# Patient Record
Sex: Male | Born: 1985 | Race: White | Hispanic: No | Marital: Married | Smoking: Current every day smoker
Health system: Southern US, Community
[De-identification: ages and names within clinical notes are randomized; demographics above are authoritative.]

## PROBLEM LIST (undated history)

## (undated) DIAGNOSIS — E7800 Pure hypercholesterolemia, unspecified: Secondary | ICD-10-CM

## (undated) DIAGNOSIS — I10 Essential (primary) hypertension: Secondary | ICD-10-CM

## (undated) DIAGNOSIS — K219 Gastro-esophageal reflux disease without esophagitis: Secondary | ICD-10-CM

---

## 2008-05-13 ENCOUNTER — Emergency Department (HOSPITAL_COMMUNITY): Admission: EM | Admit: 2008-05-13 | Discharge: 2008-05-13 | Payer: Self-pay | Admitting: Emergency Medicine

## 2010-06-07 ENCOUNTER — Emergency Department (HOSPITAL_COMMUNITY): Admission: EM | Admit: 2010-06-07 | Discharge: 2010-06-07 | Payer: Self-pay | Admitting: Emergency Medicine

## 2010-12-30 LAB — DIFFERENTIAL
Basophils Relative: 1 % (ref 0–1)
Eosinophils Absolute: 0.3 10*3/uL (ref 0.0–0.7)
Eosinophils Relative: 3 % (ref 0–5)
Lymphocytes Relative: 19 % (ref 12–46)
Lymphs Abs: 2.1 10*3/uL (ref 0.7–4.0)
Monocytes Absolute: 1.2 10*3/uL — ABNORMAL HIGH (ref 0.1–1.0)
Neutro Abs: 7.5 10*3/uL (ref 1.7–7.7)
Neutrophils Relative %: 67 % (ref 43–77)

## 2010-12-30 LAB — POCT CARDIAC MARKERS: Troponin i, poc: <0.05 ng/mL (ref 0.00–0.09)

## 2010-12-30 LAB — CBC
HCT: 39.9 % (ref 39.0–52.0)
Hemoglobin: 14.3 g/dL (ref 13.0–17.0)
MCV: 86 fL (ref 78.0–100.0)
RDW: 12.7 % (ref 11.5–15.5)
WBC: 11.1 10*3/uL — ABNORMAL HIGH (ref 4.0–10.5)

## 2010-12-30 LAB — COMPREHENSIVE METABOLIC PANEL
Alkaline Phosphatase: 75 U/L (ref 39–117)
CO2: 28 mEq/L (ref 19–32)
Calcium: 8.8 mg/dL (ref 8.4–10.5)
GFR calc non Af Amer: >60 mL/min (ref >60)
Glucose, Bld: 93 mg/dL (ref 70–99)
Potassium: 3.3 mEq/L — ABNORMAL LOW (ref 3.5–5.1)
Sodium: 135 mEq/L (ref 135–145)
Total Bilirubin: 0.5 mg/dL (ref 0.3–1.2)
Total Protein: 7.4 g/dL (ref 6.0–8.3)

## 2010-12-30 LAB — D-DIMER, QUANTITATIVE: D-Dimer, Quant: 0.92 ug/mL-FEU — ABNORMAL HIGH (ref 0.00–0.48)

## 2011-08-21 IMAGING — CT CT ANGIO CHEST
2 of 6 series · 19 of 36 positions shown · IV contrast (APPLIED)
Comparison: None available.

CLINICAL DATA: Chest pain.

CT ANGIOGRAPHY CHEST WITH CONTRAST
TECHNIQUE: Multidetector CT imaging of the chest was performed
using the standard protocol during bolus administration of
intravenous contrast.  Multiplanar CT image reconstructions
including MIPs were obtained to evaluate the vascular anatomy.
Contrast:  100 ml of [id number]

[Series 8: pulm embolism 1.0 b25f thins · axial · 0.63mm/px · z∈[-254,-38]mm · 18 of 240 slices shown]
[im 12/240  lung]
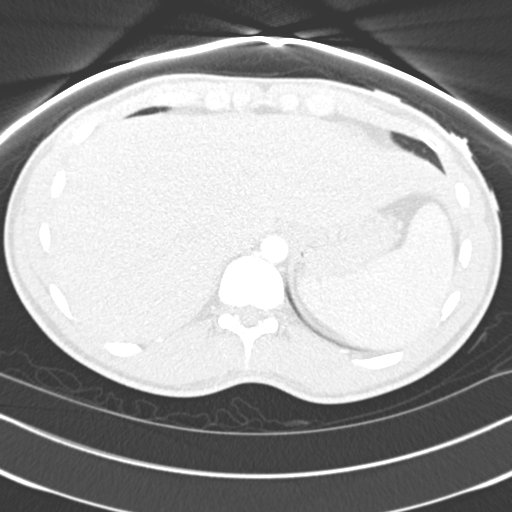
[im 24/240  mediastinal]
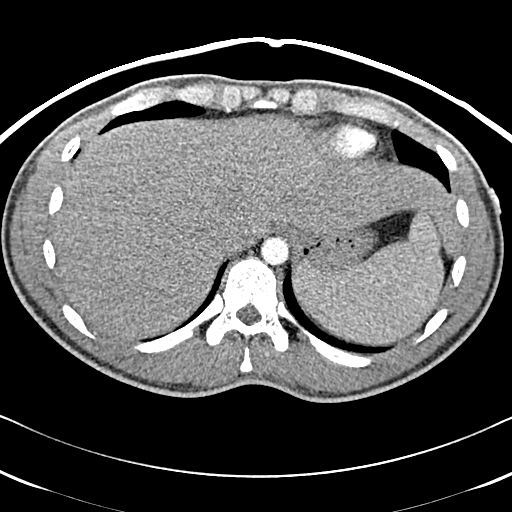
[im 36/240  lung]
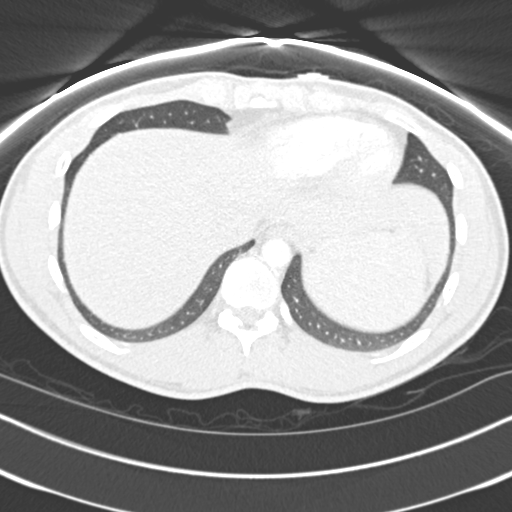
[im 48/240  mediastinal]
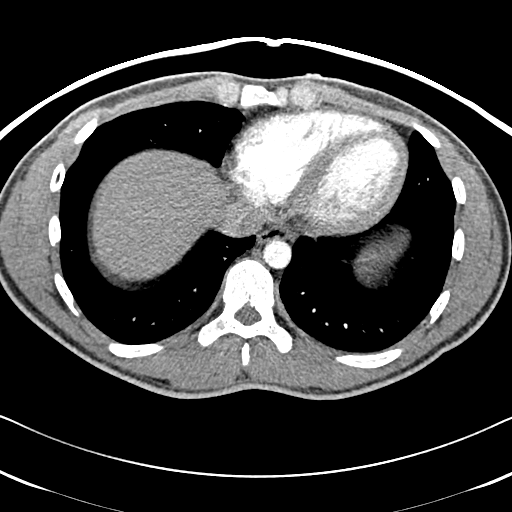
[im 60/240  lung]
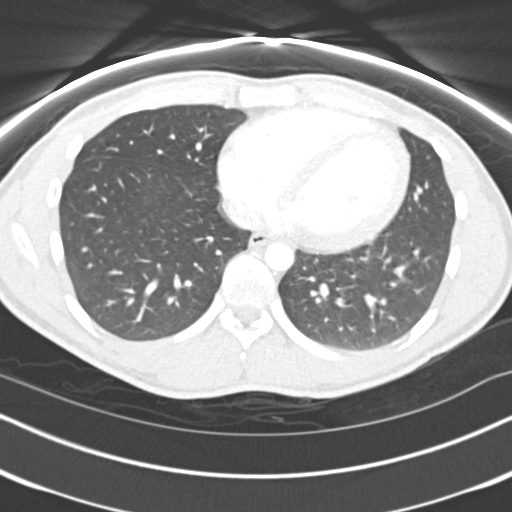
[im 72/240  mediastinal]
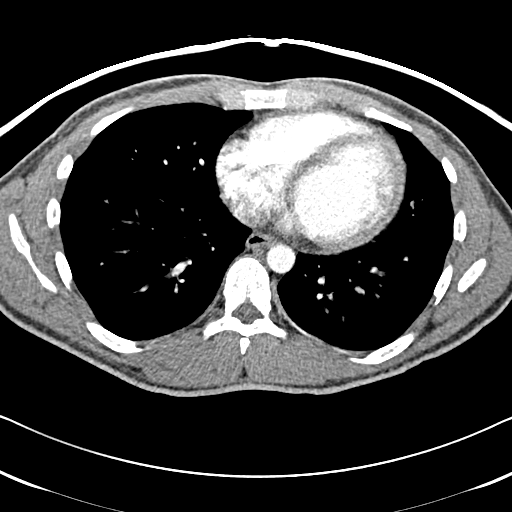
[im 84/240  lung]
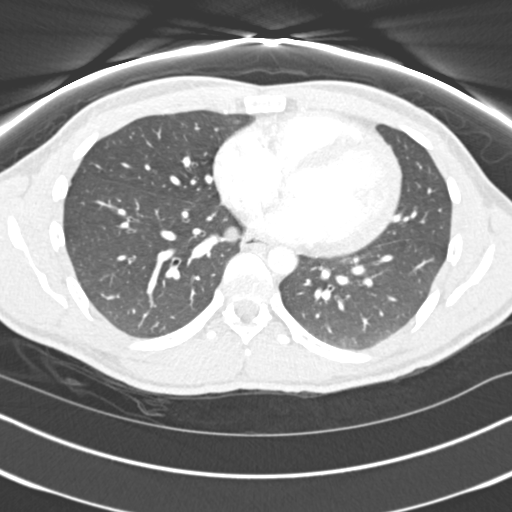
[im 96/240  mediastinal]
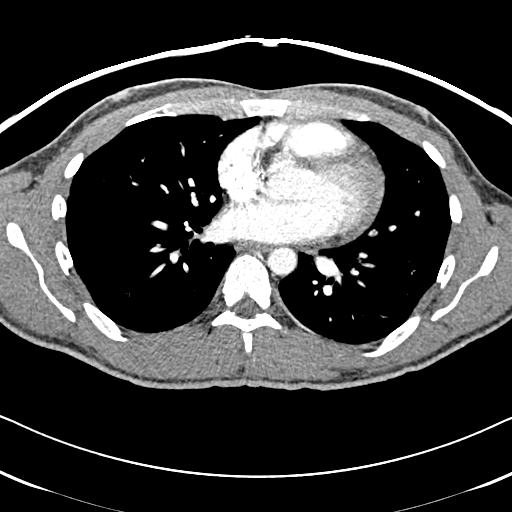
[im 108/240  lung]
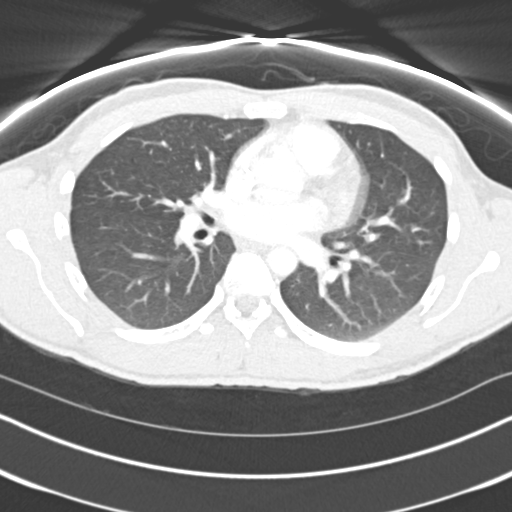
[im 132/240  mediastinal]
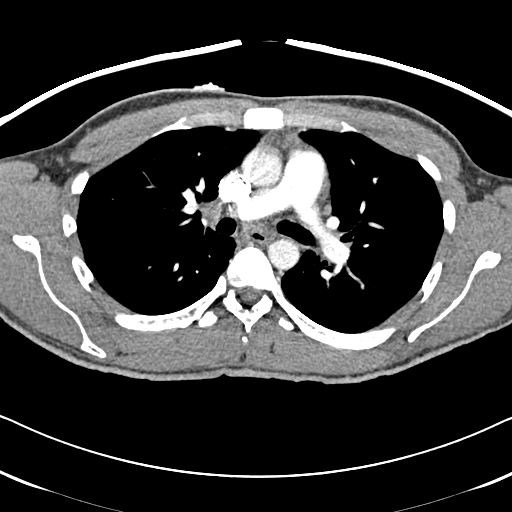
[im 144/240  lung]
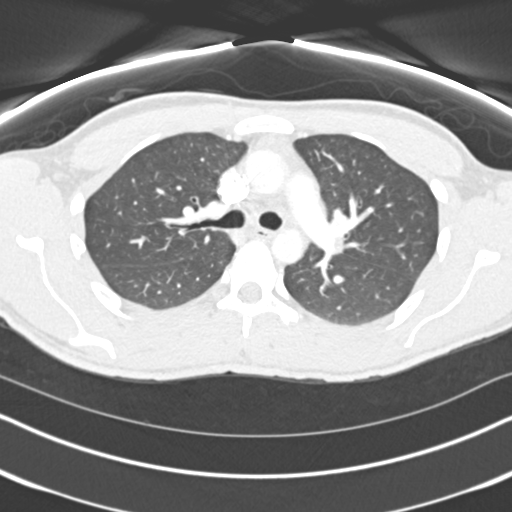
[im 156/240  mediastinal]
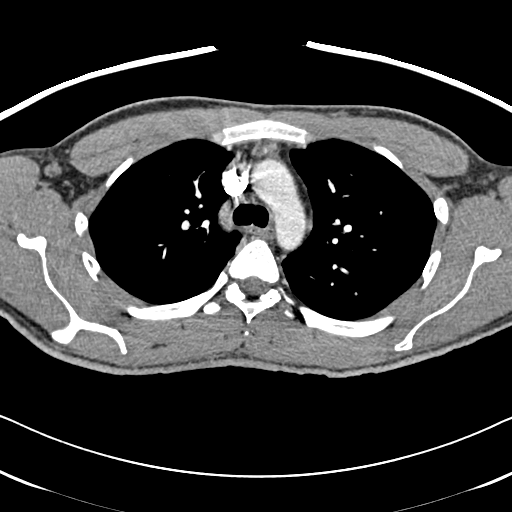
[im 168/240  lung]
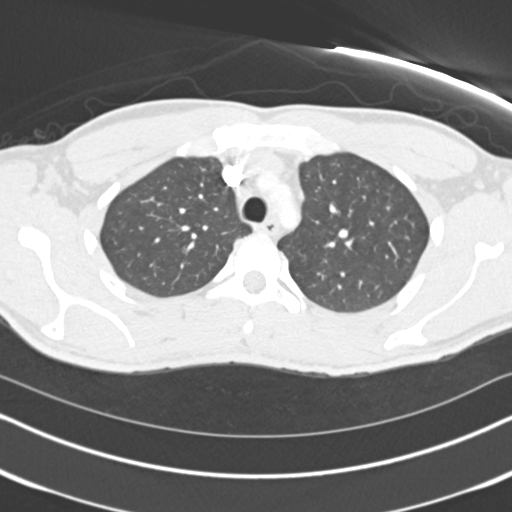
[im 180/240  mediastinal]
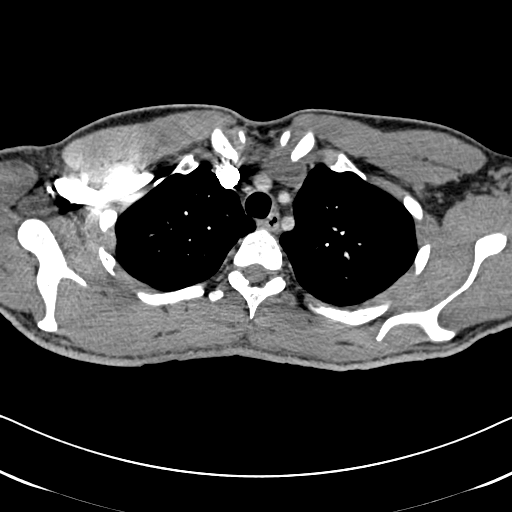
[im 192/240  lung]
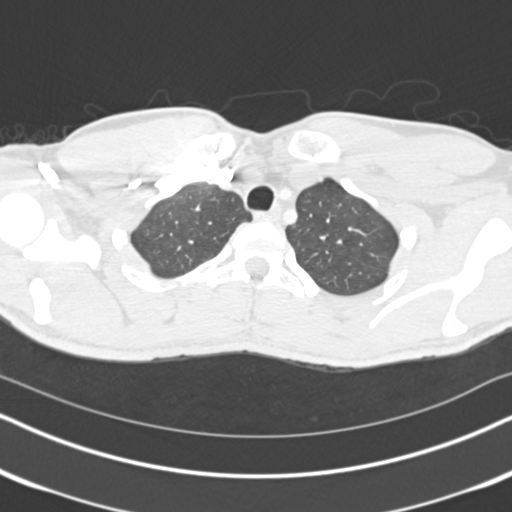
[im 204/240  mediastinal]
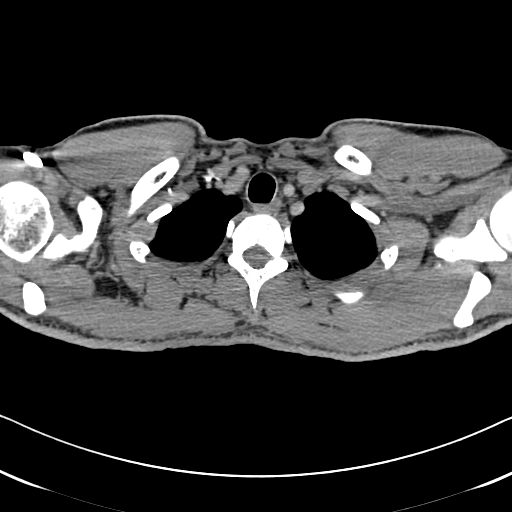
[im 216/240  lung]
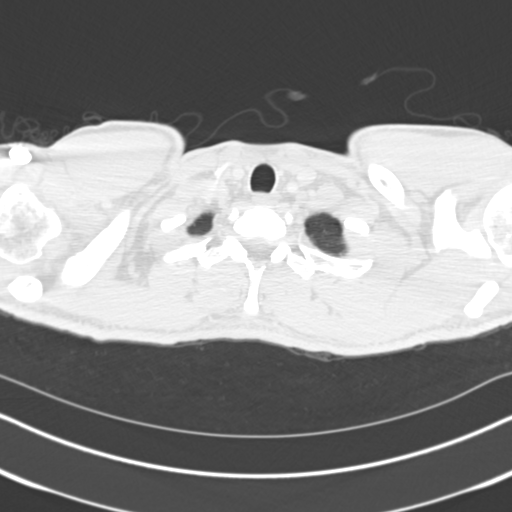
[im 228/240  mediastinal]
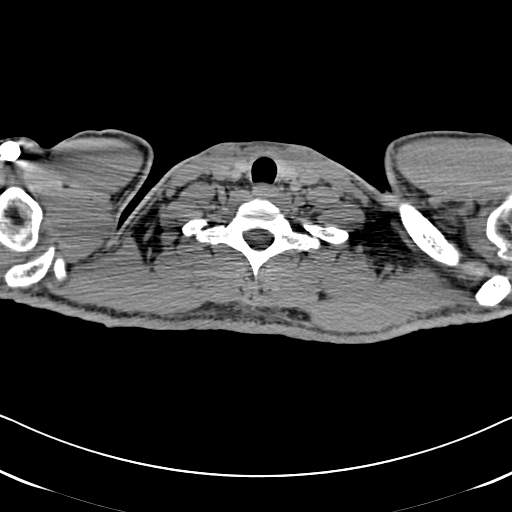

[Series 602: cor · coronal · 0.63mm/px · 1 of 81 slices shown]
[im 41/81  mediastinal]
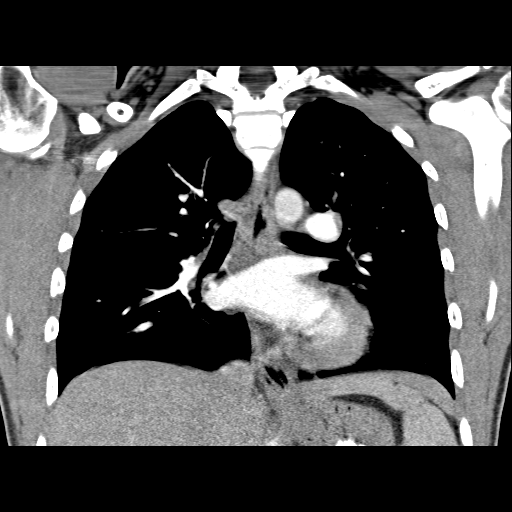

[19 of 36 positions shown; findings below may reference images not displayed]

FINDINGS: The chest wall is unremarkable.  The bony thorax is
intact.

The heart is normal in size.  No pericardial effusion.  No
mediastinal or hilar adenopathy.  The esophagus is grossly normal.
The aorta is normal in caliber.  No dissection.  Minimal residual
thymic tissue noted in the anterior mediastinum.

The pulmonary arterial tree is fairly well opacified.  No filling
defects are seen to suggest pulmonary emboli.

Examination of the lung parenchyma demonstrates no acute pulmonary
findings.  No pleural effusion.  The tracheobronchial tree appears
normal.

Review of the MIP images confirms the above findings.
IMPRESSION: 1.  No CT findings for pulmonary embolism.
2.  Normal thoracic aorta.
3.  No acute pulmonary findings.

## 2011-08-21 IMAGING — CR DG ABDOMEN ACUTE W/ 1V CHEST
3 series · 3 of 3 positions shown · non-contrast
Comparison: None.

CLINICAL DATA: Chest pain, abdominal pain

ACUTE ABDOMEN SERIES (ABDOMEN 2 VIEW & CHEST 1 VIEW)

[w chest pa]
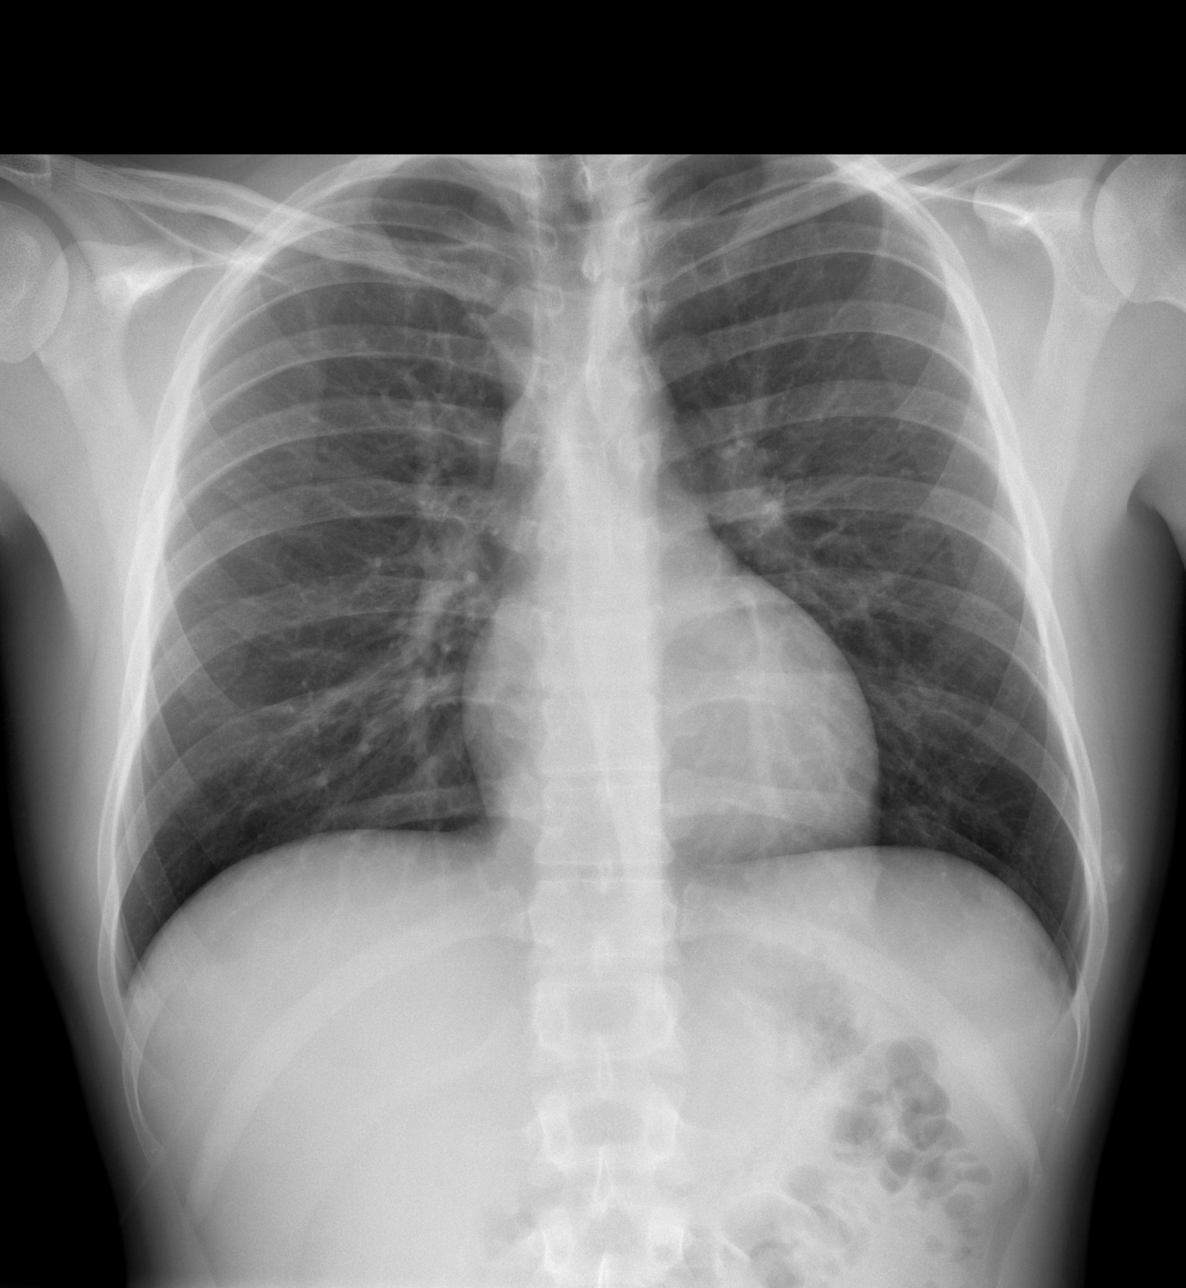

[w abdomen upright]
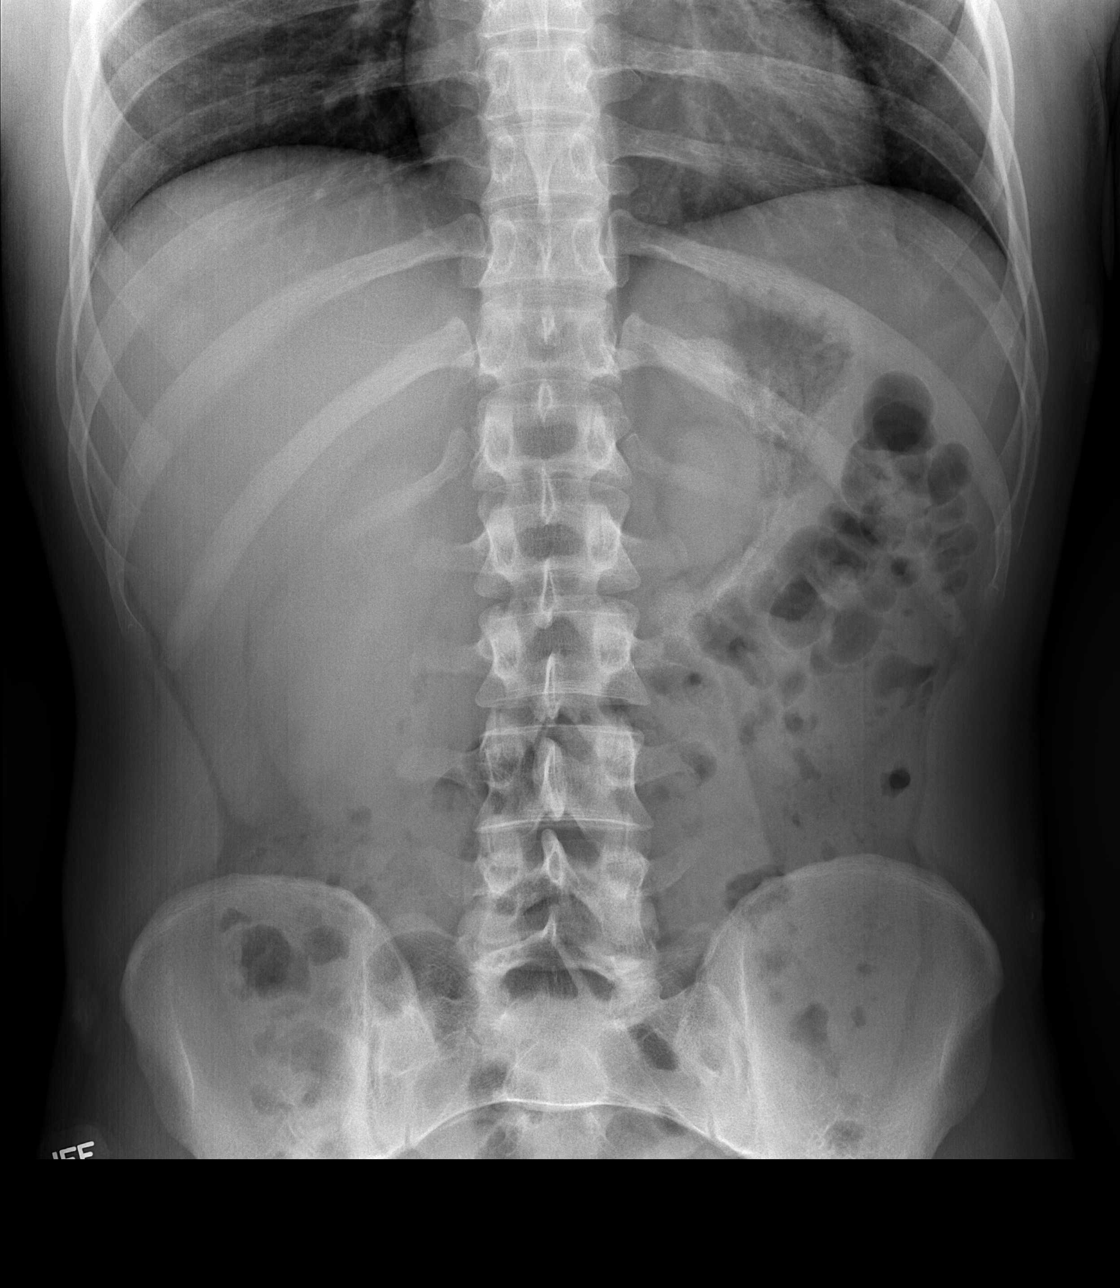

[t abdomen supine]
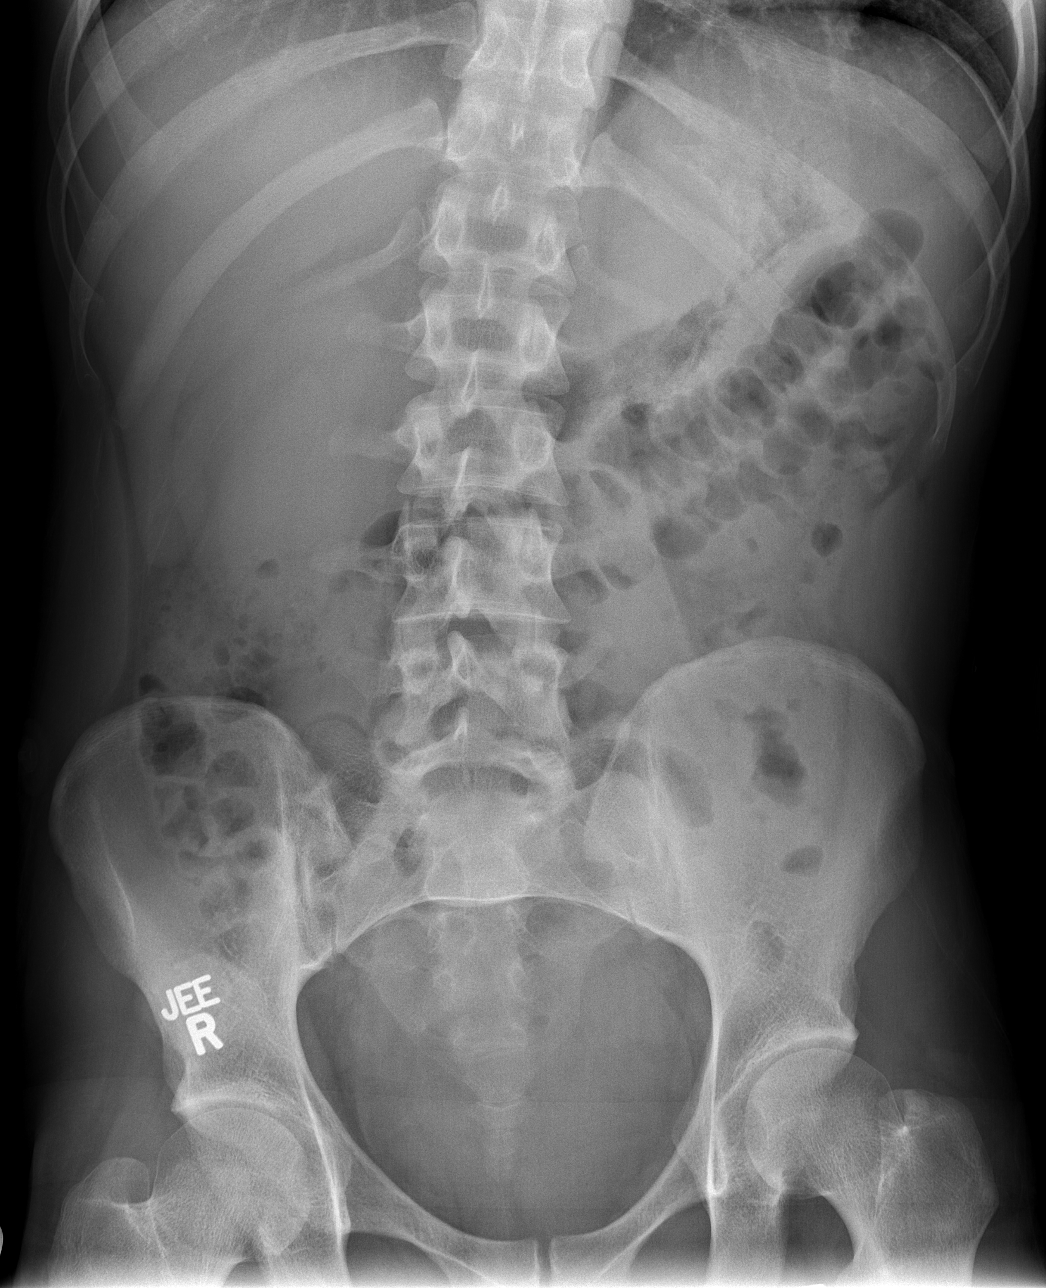

[3 of 3 positions shown; findings below may reference images not displayed]

FINDINGS: Normal mediastinum and cardiac silhouette.  Lungs are
clear.  No free air beneath hemidiaphragms.

No dilated loops of large or small bowel.  No gas the rectum on
this film.  No pathologic calcifications.  No acute bony
abnormality.
IMPRESSION: 1.  No no acute cardiopulmonary process.
2.  No evidence of intraperitoneal free air or bowel obstruction.

## 2021-08-26 ENCOUNTER — Other Ambulatory Visit: Payer: Self-pay

## 2021-08-26 ENCOUNTER — Ambulatory Visit (HOSPITAL_BASED_OUTPATIENT_CLINIC_OR_DEPARTMENT_OTHER): Payer: 59 | Admitting: Anesthesiology

## 2021-08-26 ENCOUNTER — Encounter (HOSPITAL_BASED_OUTPATIENT_CLINIC_OR_DEPARTMENT_OTHER): Payer: Self-pay | Admitting: Orthopedic Surgery

## 2021-08-26 ENCOUNTER — Encounter (HOSPITAL_BASED_OUTPATIENT_CLINIC_OR_DEPARTMENT_OTHER)
Admission: RE | Disposition: A | Discharge status: Home / Self Care | Payer: Self-pay | Source: Ambulatory Visit | Attending: Orthopedic Surgery

## 2021-08-26 ENCOUNTER — Other Ambulatory Visit: Payer: Self-pay | Admitting: Orthopedic Surgery

## 2021-08-26 ENCOUNTER — Ambulatory Visit (HOSPITAL_BASED_OUTPATIENT_CLINIC_OR_DEPARTMENT_OTHER)
Admission: RE | Admit: 2021-08-26 | Discharge: 2021-08-26 | Disposition: A | Discharge status: Home / Self Care | Payer: 59 | Source: Ambulatory Visit | Attending: Orthopedic Surgery | Admitting: Orthopedic Surgery

## 2021-08-26 DIAGNOSIS — I10 Essential (primary) hypertension: Secondary | ICD-10-CM | POA: Insufficient documentation

## 2021-08-26 DIAGNOSIS — M00042 Staphylococcal arthritis, left hand: Secondary | ICD-10-CM | POA: Diagnosis present

## 2021-08-26 DIAGNOSIS — F17200 Nicotine dependence, unspecified, uncomplicated: Secondary | ICD-10-CM | POA: Insufficient documentation

## 2021-08-26 HISTORY — DX: Gastro-esophageal reflux disease without esophagitis: K21.9

## 2021-08-26 HISTORY — DX: Essential (primary) hypertension: I10

## 2021-08-26 HISTORY — DX: Pure hypercholesterolemia, unspecified: E78.00

## 2021-08-26 HISTORY — PX: INCISION AND DRAINAGE: SHX5863

## 2021-08-26 SURGERY — INCISION AND DRAINAGE
Anesthesia: General | Site: Hand | Laterality: Left

## 2021-08-26 MED ORDER — ONDANSETRON HCL 4 MG/2ML IJ SOLN
INTRAMUSCULAR | Status: AC
  Filled 2021-08-26: qty 2

## 2021-08-26 MED ORDER — FENTANYL CITRATE (PF) 100 MCG/2ML IJ SOLN
INTRAMUSCULAR | Status: DC | PRN
  Administered 2021-08-26 (×2): 50 ug via INTRAVENOUS

## 2021-08-26 MED ORDER — MIDAZOLAM HCL 2 MG/2ML IJ SOLN
INTRAMUSCULAR | Status: AC
  Filled 2021-08-26: qty 2

## 2021-08-26 MED ORDER — 0.9 % SODIUM CHLORIDE (POUR BTL) OPTIME
TOPICAL | Status: DC | PRN
  Administered 2021-08-26: 100 mL

## 2021-08-26 MED ORDER — MIDAZOLAM HCL 5 MG/5ML IJ SOLN
INTRAMUSCULAR | Status: DC | PRN
Start: 2021-08-26 — End: 2021-08-26
  Administered 2021-08-26: 2 mg via INTRAVENOUS

## 2021-08-26 MED ORDER — CEFAZOLIN SODIUM-DEXTROSE 2-4 GM/100ML-% IV SOLN
INTRAVENOUS | Status: AC
  Filled 2021-08-26: qty 100

## 2021-08-26 MED ORDER — ACETAMINOPHEN 10 MG/ML IV SOLN: 1000.0000 mg | Freq: Once | INTRAVENOUS | Status: DC | PRN

## 2021-08-26 MED ORDER — CEFAZOLIN SODIUM-DEXTROSE 2-3 GM-%(50ML) IV SOLR
INTRAVENOUS | Status: DC | PRN
  Administered 2021-08-26: 2 g via INTRAVENOUS

## 2021-08-26 MED ORDER — OXYCODONE HCL 5 MG PO TABS: 5.0000 mg | ORAL_TABLET | Freq: Once | ORAL | Status: DC | PRN

## 2021-08-26 MED ORDER — LIDOCAINE 2% (20 MG/ML) 5 ML SYRINGE
INTRAMUSCULAR | Status: AC
  Filled 2021-08-26: qty 5

## 2021-08-26 MED ORDER — PROPOFOL 10 MG/ML IV BOLUS
INTRAVENOUS | Status: DC | PRN
  Administered 2021-08-26: 180 mg via INTRAVENOUS

## 2021-08-26 MED ORDER — BUPIVACAINE HCL (PF) 0.25 % IJ SOLN
INTRAMUSCULAR | Status: DC | PRN
  Administered 2021-08-26: 9 mL

## 2021-08-26 MED ORDER — OXYCODONE HCL 5 MG/5ML PO SOLN: 5.0000 mg | Freq: Once | ORAL | Status: DC | PRN

## 2021-08-26 MED ORDER — FENTANYL CITRATE (PF) 100 MCG/2ML IJ SOLN
INTRAMUSCULAR | Status: AC
  Filled 2021-08-26: qty 2

## 2021-08-26 MED ORDER — LACTATED RINGERS IV SOLN: INTRAVENOUS | Status: DC

## 2021-08-26 MED ORDER — PROPOFOL 10 MG/ML IV BOLUS
INTRAVENOUS | Status: AC
  Filled 2021-08-26: qty 40

## 2021-08-26 MED ORDER — ACETAMINOPHEN 160 MG/5ML PO SOLN: 1000.0000 mg | Freq: Once | ORAL | Status: DC | PRN

## 2021-08-26 MED ORDER — FENTANYL CITRATE (PF) 100 MCG/2ML IJ SOLN: 25.0000 ug | INTRAMUSCULAR | Status: DC | PRN

## 2021-08-26 MED ORDER — DEXAMETHASONE SODIUM PHOSPHATE 10 MG/ML IJ SOLN
INTRAMUSCULAR | Status: DC | PRN
  Administered 2021-08-26: 10 mg via INTRAVENOUS

## 2021-08-26 MED ORDER — DEXAMETHASONE SODIUM PHOSPHATE 10 MG/ML IJ SOLN
INTRAMUSCULAR | Status: AC
  Filled 2021-08-26: qty 1

## 2021-08-26 MED ORDER — ACETAMINOPHEN 500 MG PO TABS: 1000.0000 mg | ORAL_TABLET | Freq: Once | ORAL | Status: DC | PRN

## 2021-08-26 MED ORDER — HYDROCODONE-ACETAMINOPHEN 5-325 MG PO TABS: ORAL_TABLET | ORAL | 0 refills | Status: AC

## 2021-08-26 MED ORDER — LIDOCAINE 2% (20 MG/ML) 5 ML SYRINGE
INTRAMUSCULAR | Status: DC | PRN
  Administered 2021-08-26: 60 mg via INTRAVENOUS

## 2021-08-26 SURGICAL SUPPLY — 53 items
APL PRP STRL LF DISP 70% ISPRP (MISCELLANEOUS) ×1
BAG DECANTER FOR FLEXI CONT (MISCELLANEOUS) IMPLANT
BLADE MINI RND TIP GREEN BEAV (BLADE) IMPLANT
BLADE SURG 15 STRL LF DISP TIS (BLADE) ×2 IMPLANT
BLADE SURG 15 STRL SS (BLADE) ×4
BNDG CMPR 9X4 STRL LF SNTH (GAUZE/BANDAGES/DRESSINGS) ×1
BNDG COHESIVE 1X5 TAN STRL LF (GAUZE/BANDAGES/DRESSINGS) ×2 IMPLANT
BNDG ELASTIC 2X5.8 VLCR STR LF (GAUZE/BANDAGES/DRESSINGS) IMPLANT
BNDG ELASTIC 3X5.8 VLCR STR LF (GAUZE/BANDAGES/DRESSINGS) IMPLANT
BNDG ESMARK 4X9 LF (GAUZE/BANDAGES/DRESSINGS) ×2 IMPLANT
BNDG GAUZE 1X2.1 STRL (MISCELLANEOUS) IMPLANT
BNDG GAUZE ELAST 4 BULKY (GAUZE/BANDAGES/DRESSINGS) IMPLANT
CHLORAPREP W/TINT 26 (MISCELLANEOUS) ×2 IMPLANT
CORD BIPOLAR FORCEPS 12FT (ELECTRODE) ×2 IMPLANT
COVER BACK TABLE 60X90IN (DRAPES) ×2 IMPLANT
COVER MAYO STAND STRL (DRAPES) ×2 IMPLANT
CUFF TOURN SGL QUICK 18X4 (TOURNIQUET CUFF) ×2 IMPLANT
DRAPE EXTREMITY T 121X128X90 (DISPOSABLE) ×2 IMPLANT
DRAPE SURG 17X23 STRL (DRAPES) ×2 IMPLANT
GAUZE 4X4 16PLY ~~LOC~~+RFID DBL (SPONGE) ×2 IMPLANT
GAUZE PACKING IODOFORM 1/4X15 (PACKING) ×2 IMPLANT
GAUZE SPONGE 4X4 12PLY STRL (GAUZE/BANDAGES/DRESSINGS) ×2 IMPLANT
GAUZE XEROFORM 1X8 LF (GAUZE/BANDAGES/DRESSINGS) ×2 IMPLANT
GLOVE SRG 8 PF TXTR STRL LF DI (GLOVE) ×1 IMPLANT
GLOVE SURG ENC MOIS LTX SZ7.5 (GLOVE) ×2 IMPLANT
GLOVE SURG POLYISO LF SZ6.5 (GLOVE) ×2 IMPLANT
GLOVE SURG UNDER POLY LF SZ6.5 (GLOVE) ×2 IMPLANT
GLOVE SURG UNDER POLY LF SZ8 (GLOVE) ×2
GOWN STRL REUS W/ TWL LRG LVL3 (GOWN DISPOSABLE) ×1 IMPLANT
GOWN STRL REUS W/TWL LRG LVL3 (GOWN DISPOSABLE) ×2
GOWN STRL REUS W/TWL XL LVL3 (GOWN DISPOSABLE) ×2 IMPLANT
LOOP VESSEL MAXI BLUE (MISCELLANEOUS) IMPLANT
NEEDLE BLUNT 17GA (NEEDLE) IMPLANT
NEEDLE HYPO 25X1 1.5 SAFETY (NEEDLE) IMPLANT
NS IRRIG 1000ML POUR BTL (IV SOLUTION) ×2 IMPLANT
PACK BASIN DAY SURGERY FS (CUSTOM PROCEDURE TRAY) ×2 IMPLANT
PAD CAST 3X4 CTTN HI CHSV (CAST SUPPLIES) IMPLANT
PADDING CAST ABS 4INX4YD NS (CAST SUPPLIES) ×1
PADDING CAST ABS COTTON 4X4 ST (CAST SUPPLIES) ×1 IMPLANT
PADDING CAST COTTON 3X4 STRL (CAST SUPPLIES)
SPLINT PLASTER CAST XFAST 3X15 (CAST SUPPLIES) IMPLANT
SPLINT PLASTER XTRA FASTSET 3X (CAST SUPPLIES)
STOCKINETTE 4X48 STRL (DRAPES) ×2 IMPLANT
SUT ETHILON 4 0 PS 2 18 (SUTURE) ×2 IMPLANT
SWAB COLLECTION DEVICE MRSA (MISCELLANEOUS) ×2 IMPLANT
SWAB CULTURE ESWAB REG 1ML (MISCELLANEOUS) ×2 IMPLANT
SYR 20ML LL LF (SYRINGE) IMPLANT
SYR BULB EAR ULCER 3OZ GRN STR (SYRINGE) ×2 IMPLANT
SYR CONTROL 10ML LL (SYRINGE) IMPLANT
SYR TOOMEY 50ML (SYRINGE) IMPLANT
TOWEL GREEN STERILE FF (TOWEL DISPOSABLE) ×4 IMPLANT
TUBE FEEDING ENTERAL 5FR 16IN (TUBING) IMPLANT
UNDERPAD 30X36 HEAVY ABSORB (UNDERPADS AND DIAPERS) ×2 IMPLANT

## 2021-08-26 NOTE — Discharge Instructions (Addendum)

## 2021-08-26 NOTE — Transfer of Care (Signed)
Immediate Anesthesia Transfer of Care Note  Patient: Blake Moreno  Procedure(s) Performed: INCISION AND DRAINAGE LEFT THUMB (Left: Hand)  Patient Location: PACU  Anesthesia Type:General  Level of Consciousness: sedated  Airway & Oxygen Therapy: Patient Spontanous Breathing and Patient connected to face mask oxygen  Post-op Assessment: Report given to RN and Post -op Vital signs reviewed and stable  Post vital signs: Reviewed and stable  Last Vitals:  Vitals Value Taken Time  BP 111/73 08/26/21 1615  Temp    Pulse 74 08/26/21 1616  Resp    SpO2 99 % 08/26/21 1616  Vitals shown include unvalidated device data.  Last Pain:  Vitals:   08/26/21 1357  TempSrc: Oral  PainSc: 0-No pain         Complications: No notable events documented.

## 2021-08-26 NOTE — H&P (Addendum)
Blake Moreno is an 35 y.o. male.   Chief Complaint: joint infection HPI: 35 yo male present with wife states he got a staple in his left thumb 12 days ago.  Has had 4 days of worsening pain and swelling at mp joint of thumb.  Seen at [facility] yesterday.  Started on antibiotics and followed up in office this morning.  He wishes to proceed with incision and drainage right thumb.  Allergies: No Known Allergies  Past Medical History:  Diagnosis Date   GERD (gastroesophageal reflux disease)    Hypercholesteremia    Hypertension     History reviewed. No pertinent surgical history.  Family History: History reviewed. No pertinent family history.  Social History:   reports that he has been smoking cigarettes. He has been smoking an average of 1 pack per day. He does not have any smokeless tobacco history on file. He reports that he does not currently use alcohol. He reports that he does not use drugs.  Medications: Medications Prior to Admission  Medication Sig Dispense Refill   amLODipine (NORVASC) 5 MG tablet Take 5 mg by mouth daily.     atorvastatin (LIPITOR) 10 MG tablet Take 5 mg by mouth daily.     doxycycline (VIBRAMYCIN) 100 MG capsule Take 100 mg by mouth 2 (two) times daily.     HYDROcodone-acetaminophen (NORCO) 7.5-325 MG tablet Take 1 tablet by mouth every 6 (six) hours as needed for moderate pain.     meloxicam (MOBIC) 15 MG tablet Take 15 mg by mouth daily.     omeprazole (PRILOSEC) 10 MG capsule Take 10 mg by mouth daily.      No results found for this or any previous visit (from the past 48 hour(s)).  No results found.    Blood pressure (!) 145/90, pulse (!) 116, temperature 97.7 F (36.5 C), temperature source Oral, resp. rate 18, height 5\' 5"  (1.651 m), weight 70 kg, SpO2 100 %.  General appearance: alert, cooperative, and appears stated age Head: Normocephalic, without obvious abnormality, atraumatic Neck: supple, symmetrical, trachea midline Cardio: regular rate and  rhythm Resp: clear to auscultation bilaterally Extremities: Intact sensation and capillary refill all digits.  +epl/fpl/io.  No wounds.  Pulses: 2+ and symmetric Skin: Skin color, texture, turgor normal. No rashes or lesions Neurologic: Grossly normal Incision/Wound: none  Assessment/Plan Left thumb mp joint infection.  Plan incision and drainage in OR.  Non operative and operative treatment options have been discussed with the patient and patient wishes to proceed with operative treatment. Risks, benefits, and alternatives of surgery have been discussed and the patient agrees with the plan of care.   08/26/2021, 3:28 PM

## 2021-08-26 NOTE — Op Note (Addendum)
NAME: Blake Moreno MEDICAL RECORD NO: [id number] DATE OF BIRTH: Aug 24, 1986 FACILITY: [facility] LOCATION: [facility] PHYSICIAN: Tami Ribas, MD   OPERATIVE REPORT   DATE OF PROCEDURE: 08/26/21    PREOPERATIVE DIAGNOSIS: Left thumb MP joint septic arthritis   POSTOPERATIVE DIAGNOSIS: Left thumb MP joint septic arthritis   PROCEDURE: Incision and drainage Left thumb MP joint   SURGEON:  Betha Loa, M.D.   ASSISTANT: none   ANESTHESIA:  General   INTRAVENOUS FLUIDS:  Per anesthesia flow sheet.   ESTIMATED BLOOD LOSS:  Minimal.   COMPLICATIONS:  None.   SPECIMENS: Cultures to micro   TOURNIQUET TIME:    Total Tourniquet Time Documented: Upper Arm (Left) - 14 minutes Total: Upper Arm (Left) - 14 minutes    DISPOSITION:  Stable to PACU.   INDICATIONS: 35 year old male states 12 days ago he got a staple in his right thumb.  He pulled this out on his own.  Over the past 4 days he has had increasing swelling and pain of the thumb.  He presented to urgent care yesterday evening and was started on antibiotics.  Follow-up in the office.  He wishes to proceed with operative incision and drainage for infected joint.  Risks, benefits and alternatives of surgery were discussed including the risks of blood loss, infection, damage to nerves, vessels, tendons, ligaments, bone for surgery, need for additional surgery, complications with wound healing, continued pain, stiffness need for repeat irrigation and debridement.  He voiced understanding of these risks and elected to proceed.  OPERATIVE COURSE:  After being identified preoperatively by myself,  the patient and I agreed on the procedure and site of the procedure.  The surgical site was marked.  Surgical consent had been signed. He was given IV antibiotics as preoperative antibiotic prophylaxis. He was transferred to the operating room and placed on the operating table in supine position with the Left upper extremity on  an arm board.  General anesthesia was induced by the anesthesiologist.  Left upper extremity was prepped and draped in normal sterile orthopedic fashion.  A surgical pause was performed between the surgeons, anesthesia, and operating room staff and all were in agreement as to the patient, procedure, and site of procedure.  Tourniquet at the proximal aspect of the extremity was inflated to 250 mmHg after exsanguination of the arm with an Esmarch bandage.  Incision was made at the dorsal ulnar aspect of the thumb over the MP joint and the location of the traumatic portion of the wound.  This was carried into subcutaneous tissues by spreading technique.  Neurovascular structures were protected throughout the case.  The joint was entered underneath the extensor tendon.  A small portion of the abductor aponeurosis was released to allow access to the MP joint.  There was cloudy fluid within the joint.  Cultures were taken for aerobes and aerobes.  A portion of the dorsal capsule was excised to allow for drainage.  The joint was copiously irrigated with sterile saline by bulb syringe.  Quarter-inch iodoform gauze was then placed underneath the extensor tendon as a wick from the joint and the wound packed with the iodoform gauze.  Quarter percent plain Marcaine was injected to aid in postoperative analgesia.  Wound was dressed with sterile 4 x 4's and wrapped with Coban dressing lightly.  An AlumaFoam splint was placed and wrapped lightly with Coban dressing.  The tourniquet was deflated at 14 minutes.  Fingertips were pink with brisk capillary refill after  deflation of tourniquet.  The operative  drapes were broken down.  The patient was awoken from anesthesia safely.  He was transferred back to the stretcher and taken to PACU in stable condition.  I will see him back in the office in 3-4 days for postoperative followup.  I will give him a prescription for Norco 5/325 1-2 tabs PO q6 hours prn pain, dispense # 20.  He will  continue on doxycycline was prescribed yesterday. Debridement type: Excisional Debridement  Side: left  Body Location: Thumb MP joint  Tools used for debridement: scissors  Debridement depth beyond dead/damaged tissue down to healthy viable tissue: no  Tissue layer involved: skin, subcutaneous tissue, muscle / fascia  Nature of tissue removed: Purulence  Irrigation volume: 250 cc     Irrigation fluid type: Normal Saline    Betha Loa, MD Electronically signed, 08/26/21

## 2021-08-26 NOTE — Anesthesia Procedure Notes (Signed)
Procedure Name: LMA Insertion Date/Time: 08/26/2021 3:43 PM Performed by: Lauralyn Primes, CRNA Pre-anesthesia Checklist: Patient identified, Emergency Drugs available, Suction available and Patient being monitored Patient Re-evaluated:Patient Re-evaluated prior to induction Oxygen Delivery Method: Circle system utilized Preoxygenation: Pre-oxygenation with 100% oxygen Induction Type: IV induction Ventilation: Mask ventilation without difficulty LMA: LMA inserted LMA Size: 5.0 Number of attempts: 1 Airway Equipment and Method: Bite block Placement Confirmation: positive ETCO2 Tube secured with: Tape Dental Injury: Teeth and Oropharynx as per pre-operative assessment

## 2021-08-26 NOTE — Anesthesia Preprocedure Evaluation (Addendum)
Anesthesia Evaluation  Patient identified by MRN, date of birth, ID band Patient awake    Reviewed: Allergy & Precautions, NPO status , Patient's Chart, lab work & pertinent test results  History of Anesthesia Complications Negative for: history of anesthetic complications  Airway Mallampati: III  TM Distance: >3 FB Neck ROM: Full    Dental  (+) Edentulous Upper, Edentulous Lower, Dental Advisory Given   Pulmonary neg shortness of breath, neg COPD, neg recent URI, Current SmokerPatient did not abstain from smoking.,    breath sounds clear to auscultation       Cardiovascular hypertension, Pt. on medications  Rhythm:Regular     Neuro/Psych negative neurological ROS  negative psych ROS   GI/Hepatic Neg liver ROS, GERD  Controlled,  Endo/Other  negative endocrine ROS  Renal/GU negative Renal ROS     Musculoskeletal INFECTION LEFT THUMB   Abdominal   Peds  Hematology negative hematology ROS (+)   Anesthesia Other Findings   Reproductive/Obstetrics                            Anesthesia Physical Anesthesia Plan  ASA: 2  Anesthesia Plan: General   Post-op Pain Management:    Induction: Intravenous  PONV Risk Score and Plan: 1 and Ondansetron and Dexamethasone  Airway Management Planned: LMA  Additional Equipment: None  Intra-op Plan:   Post-operative Plan: Extubation in OR  Informed Consent: I have reviewed the patients History and Physical, chart, labs and discussed the procedure including the risks, benefits and alternatives for the proposed anesthesia with the patient or authorized representative who has indicated his/her understanding and acceptance.     Dental advisory given  Plan Discussed with: CRNA and Anesthesiologist  Anesthesia Plan Comments:         Anesthesia Quick Evaluation

## 2021-08-27 NOTE — Anesthesia Postprocedure Evaluation (Signed)
Anesthesia Post Note  Patient: Blake Moreno  Procedure(s) Performed: INCISION AND DRAINAGE LEFT THUMB (Left: Hand)     Patient location during evaluation: PACU Anesthesia Type: General Level of consciousness: awake and alert Pain management: pain level controlled Vital Signs Assessment: post-procedure vital signs reviewed and stable Respiratory status: spontaneous breathing, nonlabored ventilation, respiratory function stable and patient connected to nasal cannula oxygen Cardiovascular status: blood pressure returned to baseline and stable Postop Assessment: no apparent nausea or vomiting Anesthetic complications: no   No notable events documented.  Last Vitals:  Vitals:   08/26/21 1640 08/26/21 1650  BP:  128/86  Pulse: 85 82  Resp: 16 16  Temp:  36.7 C  SpO2: 98% 97%    Last Pain:  Vitals:   08/26/21 1650  TempSrc: Oral  PainSc: 0-No pain                 Arcenio Mullaly

## 2021-08-29 ENCOUNTER — Encounter (HOSPITAL_BASED_OUTPATIENT_CLINIC_OR_DEPARTMENT_OTHER): Payer: Self-pay | Admitting: Orthopedic Surgery

## 2021-08-31 LAB — AEROBIC/ANAEROBIC CULTURE W GRAM STAIN (SURGICAL/DEEP WOUND)
